# Patient Record
Sex: Female | Born: 1975 | Race: White | Hispanic: No | Marital: Single | Smoking: Former smoker
Health system: Southern US, Community
[De-identification: ages and names within clinical notes are randomized; demographics above are authoritative.]

## PROBLEM LIST (undated history)

## (undated) HISTORY — PX: NASAL SINUS SURGERY: SHX719

## (undated) HISTORY — PX: BREAST SURGERY: SHX581

---

## 2004-05-30 ENCOUNTER — Other Ambulatory Visit: Admission: RE | Admit: 2004-05-30 | Discharge: 2004-05-30 | Payer: Self-pay | Admitting: Family Medicine

## 2006-07-20 ENCOUNTER — Other Ambulatory Visit: Admission: RE | Admit: 2006-07-20 | Discharge: 2006-07-20 | Payer: Self-pay | Admitting: Family Medicine

## 2007-03-11 ENCOUNTER — Encounter: Admission: RE | Admit: 2007-03-11 | Discharge: 2007-03-11 | Payer: Self-pay | Admitting: Family Medicine

## 2012-05-23 ENCOUNTER — Ambulatory Visit (INDEPENDENT_AMBULATORY_CARE_PROVIDER_SITE_OTHER): Payer: BC Managed Care – PPO | Admitting: Nurse Practitioner

## 2012-05-23 VITALS — BP 138/62 | HR 70 | Temp 97.8°F | Ht 63.0 in | Wt 140.0 lb

## 2012-05-23 DIAGNOSIS — R55 Syncope and collapse: Secondary | ICD-10-CM

## 2012-05-23 LAB — THYROID PANEL WITH TSH
Free Thyroxine Index: 3.2 (ref 1.0–3.9)
T4, Total: 13.4 ug/dL — ABNORMAL HIGH (ref 5.0–12.5)
TSH: 2.059 u[IU]/mL (ref 0.350–4.500)

## 2012-05-23 LAB — ANEMIA PANEL 7
%SAT: 26 % (ref 20–55)
Folate: 17.9 ng/mL
Iron: 88 ug/dL (ref 42–145)
MCH: 29.1 pg (ref 26.0–34.0)
MCHC: 34.8 g/dL (ref 30.0–36.0)
MCV: 83.5 fL (ref 78.0–100.0)
Platelets: 304 10*3/uL (ref 150–400)
RBC: 4.78 MIL/uL (ref 3.87–5.11)
RDW: 12.8 % (ref 11.5–15.5)
Retic Ct Pct: 0.6 % (ref 0.4–2.3)
Vitamin B-12: 319 pg/mL (ref 211–911)

## 2012-05-23 NOTE — Patient Instructions (Signed)
Syncope Syncope is a fainting spell. This means the person loses consciousness and drops to the ground. The person is generally unconscious for less than 5 minutes. The person may have some muscle twitches for up to 15 seconds before waking up and returning to normal. Syncope occurs more often in elderly people, but it can happen to anyone. While most causes of syncope are not dangerous, syncope can be a sign of a serious medical problem. It is important to seek medical care.  CAUSES  Syncope is caused by a sudden decrease in blood flow to the brain. The specific cause is often not determined. Factors that can trigger syncope include:  Taking medicines that lower blood pressure.  Sudden changes in posture, such as standing up suddenly.  Taking more medicine than prescribed.  Standing in one place for too long.  Seizure disorders.  Dehydration and excessive exposure to heat.  Low blood sugar (hypoglycemia).  Straining to have a bowel movement.  Heart disease, irregular heartbeat, or other circulatory problems.  Fear, emotional distress, seeing blood, or severe pain. SYMPTOMS  Right before fainting, you may:  Feel dizzy or lightheaded.  Feel nauseous.  See all white or all black in your field of vision.  Have cold, clammy skin. DIAGNOSIS  Your caregiver will ask about your symptoms, perform a physical exam, and perform electrocardiography (ECG) to record the electrical activity of your heart. Your caregiver may also perform other heart or blood tests to determine the cause of your syncope. TREATMENT  In most cases, no treatment is needed. Depending on the cause of your syncope, your caregiver may recommend changing or stopping some of your medicines. HOME CARE INSTRUCTIONS  Have someone stay with you until you feel stable.  Do not drive, operate machinery, or play sports until your caregiver says it is okay.  Keep all follow-up appointments as directed by your  caregiver.  Lie down right away if you start feeling like you might faint. Breathe deeply and steadily. Wait until all the symptoms have passed.  Drink enough fluids to keep your urine clear or pale yellow.  If you are taking blood pressure or heart medicine, get up slowly, taking several minutes to sit and then stand. This can reduce dizziness. SEEK IMMEDIATE MEDICAL CARE IF:   You have a severe headache.  You have unusual pain in the chest, abdomen, or back.  You are bleeding from the mouth or rectum, or you have black or tarry stool.  You have an irregular or very fast heartbeat.  You have pain with breathing.  You have repeated fainting or seizure-like jerking during an episode.  You faint when sitting or lying down.  You have confusion.  You have difficulty walking.  You have severe weakness.  You have vision problems. If you fainted, call your local emergency services (911 in [place] Do not drive yourself to the hospital.  MAKE SURE YOU:  Understand these instructions.  Will watch your condition.  Will get help right away if you are not doing well or get worse. Document Released: 02/13/2005 Document Revised: 08/15/2011 Document Reviewed: 04/14/2011 [employer] Patient Information 2013 [employer].  

## 2012-05-23 NOTE — Progress Notes (Signed)
  Subjective:    Patient ID: Vicki Andersen, female    DOB: 25-Apr-1975, 37 y.o.   MRN: [id number]  Loss of Consciousness This is a recurrent problem. The current episode started in the past 7 days (Tuesday morning was getting ready for work. Said she completely blacked out. Did not hit her head and no loss of urine or bowel contents. Patient said was out only couple of seconds.  ). The problem occurs rarely. The problem has been unchanged. She lost consciousness for a period of less than 1 minute. Exacerbated by: Patient said nose was bleeding that morning and the sight of blood always bothers her. Also states that she had not eaten well the day before. Associated symptoms include dizziness, light-headedness, malaise/fatigue and vertigo. Pertinent negatives include no abdominal pain, bladder incontinence, bowel incontinence, chest pain, fever, nausea or weakness. She has tried nothing for the symptoms.      Review of Systems  Constitutional: Positive for malaise/fatigue. Negative for fever.  HENT: Negative.   Eyes: Negative.   Respiratory: Negative.   Cardiovascular: Positive for syncope. Negative for chest pain.  Gastrointestinal: Negative.  Negative for nausea, abdominal pain and bowel incontinence.  Genitourinary: Negative.  Negative for bladder incontinence.  Musculoskeletal: Negative.   Neurological: Positive for dizziness, vertigo and light-headedness. Negative for weakness.  Hematological: Negative.   Psychiatric/Behavioral: Negative.        Objective:   Physical Exam  Constitutional: She is oriented to person, place, and time. She appears well-developed and well-nourished.  HENT:  Head: Normocephalic.  Nose: Nose normal.  Mouth/Throat: Oropharynx is clear and moist.  Eyes: EOM are normal. Pupils are equal, round, and reactive to light.  Neck: Normal range of motion. Neck supple.  Cardiovascular: Normal rate, regular rhythm, normal heart sounds and intact distal pulses.  Exam  reveals no gallop and no friction rub.   No murmur heard. Pulmonary/Chest: Effort normal and breath sounds normal. She has no wheezes. She has no rales.  Abdominal: Soft. Bowel sounds are normal.  Lymphadenopathy:    She has no cervical adenopathy.  Neurological: She is alert and oriented to person, place, and time. She has normal reflexes. No cranial nerve deficit.  Skin: Skin is warm and dry.  Psychiatric: She has a normal mood and affect. Her behavior is normal. Judgment and thought content normal.    BP 138/62  Pulse 70  Temp(Src) 97.8 F (36.6 C) (Oral)  Ht 5\' 3"  (1.6 m)  Wt 140 lb (63.504 kg)  BMI 24.81 kg/m2  LMP 05/09/2012       Assessment & Plan:  1. Syncope Force fluids Eat 3 meals a day Keep diary   of future episodes Labs pending   Mary-Margaret Daphine Deutscher, FNP  - Anemia panel 7 - COMPLETE METABOLIC PANEL WITH GFR - Thyroid Panel With TSH

## 2012-05-24 LAB — COMPLETE METABOLIC PANEL WITH GFR
AST: 12 U/L (ref 0–37)
Alkaline Phosphatase: 37 U/L — ABNORMAL LOW (ref 39–117)
GFR, Est Non African American: >89 mL/min
Glucose, Bld: 106 mg/dL — ABNORMAL HIGH (ref 70–99)
Sodium: 139 mEq/L (ref 135–145)
Total Bilirubin: 0.3 mg/dL (ref 0.3–1.2)
Total Protein: 6.8 g/dL (ref 6.0–8.3)

## 2012-05-24 NOTE — Progress Notes (Signed)
Patient aware.

## 2012-12-03 ENCOUNTER — Other Ambulatory Visit: Payer: BC Managed Care – PPO | Admitting: Nurse Practitioner

## 2012-12-18 ENCOUNTER — Ambulatory Visit (INDEPENDENT_AMBULATORY_CARE_PROVIDER_SITE_OTHER): Payer: BC Managed Care – PPO | Admitting: Nurse Practitioner

## 2012-12-18 ENCOUNTER — Encounter: Payer: Self-pay | Admitting: Nurse Practitioner

## 2012-12-18 VITALS — BP 104/64 | HR 61 | Temp 98.1°F | Ht 64.0 in | Wt 132.0 lb

## 2012-12-18 DIAGNOSIS — Z01419 Encounter for gynecological examination (general) (routine) without abnormal findings: Secondary | ICD-10-CM

## 2012-12-18 DIAGNOSIS — Z0000 Encounter for general adult medical examination without abnormal findings: Secondary | ICD-10-CM

## 2012-12-18 LAB — POCT CBC
Granulocyte percent: 75.1 %G (ref 37–80)
Lymph, poc: 1.9 (ref 0.6–3.4)
MCV: 87.7 fL (ref 80–97)
MPV: 8.5 fL (ref 0–99.8)
POC Granulocyte: 6.4 (ref 2–6.9)
POC LYMPH PERCENT: 22.8 %L (ref 10–50)
Platelet Count, POC: 283 10*3/uL (ref 142–424)
RDW, POC: 12.4 %
WBC: 8.5 10*3/uL (ref 4.6–10.2)

## 2012-12-18 MED ORDER — NORGESTIM-ETH ESTRAD TRIPHASIC 0.18/0.215/0.25 MG-35 MCG PO TABS: 1.0000 | ORAL_TABLET | Freq: Every day | ORAL | Status: DC

## 2012-12-18 NOTE — Patient Instructions (Signed)

## 2012-12-18 NOTE — Progress Notes (Signed)
  Subjective:    Patient ID: Vicki Andersen, female    DOB: 06/09/1975, 37 y.o.   MRN: [id number]  HPI Patient in today for CPE and PAP- SHe is doing well without complaints-No current medical problems- on no meds other than birth control pills.   Review of Systems  Constitutional: Negative.   HENT: Negative.   Eyes: Negative.   Respiratory: Negative.   Cardiovascular: Negative.   Gastrointestinal: Negative.   Genitourinary: Negative.   Musculoskeletal: Negative.   Neurological: Negative.   Hematological: Negative.   Psychiatric/Behavioral: Negative.        Objective:   Physical Exam  Constitutional: She is oriented to person, place, and time. She appears well-developed and well-nourished.  HENT:  Head: Normocephalic.  Right Ear: Hearing, tympanic membrane, external ear and ear canal normal.  Left Ear: Hearing, tympanic membrane, external ear and ear canal normal.  Nose: Nose normal.  Mouth/Throat: Uvula is midline and oropharynx is clear and moist.  Eyes: Conjunctivae and EOM are normal. Pupils are equal, round, and reactive to light.  Neck: Normal range of motion and full passive range of motion without pain. Neck supple. No JVD present. Carotid bruit is not present. No mass and no thyromegaly present.  Cardiovascular: Normal rate, normal heart sounds and intact distal pulses.   No murmur heard. Pulmonary/Chest: Effort normal and breath sounds normal. Right breast exhibits no inverted nipple, no mass, no nipple discharge, no skin change and no tenderness. Left breast exhibits no inverted nipple, no mass, no nipple discharge, no skin change and no tenderness.  Abdominal: Soft. Bowel sounds are normal. She exhibits no mass. There is no tenderness.  Genitourinary: Vagina normal and uterus normal. No breast swelling, tenderness, discharge or bleeding.  bimanual exam-No adnexal masses or tenderness. Cervix nonparous and pink no discharge  Musculoskeletal: Normal range of motion.   Lymphadenopathy:    She has no cervical adenopathy.  Neurological: She is alert and oriented to person, place, and time.  Skin: Skin is warm and dry.  Psychiatric: She has a normal mood and affect. Her behavior is normal. Judgment and thought content normal.    BP 104/64  Pulse 61  Temp(Src) 98.1 F (36.7 C) (Oral)  Ht 5\' 4"  (1.626 m)  Wt 132 lb (59.875 kg)  BMI 22.65 kg/m2       Assessment & Plan:   1. Annual physical exam   2. Encounter for routine gynecological examination    Meds ordered this encounter  Medications  . Norgestimate-Ethinyl Estradiol Triphasic (TRI-PREVIFEM) 0.18/0.215/0.25 MG-35 MCG tablet    Sig: Take 1 tablet by mouth daily.    Dispense:  1 Package    Refill:  11    Order Specific Question:  Supervising Provider    Answer:  Ernestina Penna [[id number]]   Orders Placed This Encounter  Procedures  . CMP14+EGFR  . NMR, lipoprofile  . POCT CBC   Diet and exercsie encouraged Labs pending Follow up in 1 year Mary-Margaret Daphine Deutscher, FNP

## 2012-12-19 LAB — NMR, LIPOPROFILE
HDL Cholesterol by NMR: 57 mg/dL (ref >=40)
LDLC SERPL CALC-MCNC: 65 mg/dL (ref <100)

## 2012-12-19 LAB — CMP14+EGFR
ALT: 18 IU/L (ref 0–32)
AST: 19 IU/L (ref 0–40)
CO2: 22 mmol/L (ref 18–29)
Calcium: 9.4 mg/dL (ref 8.7–10.2)
Creatinine, Ser: 0.65 mg/dL (ref 0.57–1.00)
Glucose: 91 mg/dL (ref 65–99)
Potassium: 4.6 mmol/L (ref 3.5–5.2)
Sodium: 142 mmol/L (ref 134–144)

## 2012-12-23 LAB — PAP IG W/ RFLX HPV ASCU

## 2013-03-04 ENCOUNTER — Telehealth: Payer: Self-pay | Admitting: Nurse Practitioner

## 2013-03-04 ENCOUNTER — Ambulatory Visit (HOSPITAL_COMMUNITY)
Admission: RE | Admit: 2013-03-04 | Discharge: 2013-03-04 | Disposition: A | Discharge status: Home / Self Care | Payer: BC Managed Care – PPO | Source: Ambulatory Visit | Attending: General Practice | Admitting: General Practice

## 2013-03-04 ENCOUNTER — Encounter: Payer: Self-pay | Admitting: General Practice

## 2013-03-04 ENCOUNTER — Ambulatory Visit (INDEPENDENT_AMBULATORY_CARE_PROVIDER_SITE_OTHER): Payer: BC Managed Care – PPO

## 2013-03-04 ENCOUNTER — Ambulatory Visit (INDEPENDENT_AMBULATORY_CARE_PROVIDER_SITE_OTHER): Payer: BC Managed Care – PPO | Admitting: General Practice

## 2013-03-04 VITALS — BP 110/68 | HR 77 | Temp 99.3°F | Ht 63.0 in | Wt 134.0 lb

## 2013-03-04 DIAGNOSIS — R1012 Left upper quadrant pain: Secondary | ICD-10-CM

## 2013-03-04 DIAGNOSIS — R091 Pleurisy: Secondary | ICD-10-CM

## 2013-03-04 NOTE — Telephone Encounter (Signed)
appt today at 2

## 2013-03-04 NOTE — Progress Notes (Signed)
   Subjective:    Patient ID: Cristopher PeruMelanie R Fredlund, female    DOB: 04/13/1975, 38 y.o.   MRN: [id number]  Abdominal Pain This is a new problem. The current episode started yesterday. The onset quality is sudden. The problem occurs constantly. The problem has been unchanged. The pain is located in the LUQ. The pain is at a severity of 4/10. The pain is mild. The quality of the pain is aching. The abdominal pain does not radiate. Associated symptoms include belching and diarrhea. Pertinent negatives include no constipation, fever, frequency, hematuria, nausea or vomiting. Associated symptoms comments: Diarrhea on Saturday, no bm since then. The pain is aggravated by deep breathing. The pain is relieved by belching. Her past medical history is significant for PUD.      Review of Systems  Constitutional: Negative for fever and chills.  Respiratory: Negative for chest tightness and shortness of breath.   Cardiovascular: Negative for chest pain and palpitations.  Gastrointestinal: Positive for abdominal pain and diarrhea. Negative for nausea, vomiting, constipation and blood in stool.  Genitourinary: Negative for frequency and hematuria.  All other systems reviewed and are negative.       Objective:   Physical Exam  Constitutional: She is oriented to person, place, and time. She appears well-developed and well-nourished.  Cardiovascular: Normal rate, regular rhythm and normal heart sounds.   Pulmonary/Chest: Effort normal and breath sounds normal. No respiratory distress. She exhibits no tenderness.  Abdominal: Soft. Bowel sounds are normal. She exhibits no distension. There is tenderness in the left upper quadrant.  Neurological: She is alert and oriented to person, place, and time.  Skin: Skin is warm and dry.  Psychiatric: She has a normal mood and affect.   [facility] reading (PRIMARY) by Coralie KeensMae E. Diyari Cherne, FNP-C, no acute changes in abdominal xray.  [facility] reading (PRIMARY) by Coralie KeensMae E. Guerline Happ,  FNP-C, no pneumothorax, dislocation or fracture noted in chest xray.                                           Assessment & Plan:  1. LUQ abdominal pain  - DG Abd 1 View; Future - GI COCKTAIL UP TO 45 CC; Standing - GI COCKTAIL UP TO 45 CC - CT Abdomen Pelvis Wo Contrast;  (called report results: negative CT), patient informed -OTC PPI, tylenol as directed, RTO if symptoms worsen or unresolved, will refer to specialist -may seek emergency medical treatment  2. Pleurisy  - DG Chest 2 View; Future Patient verbalized understanding Coralie KeensMae E. Desmon Hitchner, FNP-C

## 2013-03-05 NOTE — Patient Instructions (Addendum)

## 2013-11-27 ENCOUNTER — Other Ambulatory Visit: Payer: Self-pay | Admitting: Nurse Practitioner

## 2014-10-23 ENCOUNTER — Other Ambulatory Visit: Payer: Self-pay | Admitting: Nurse Practitioner

## 2014-10-23 ENCOUNTER — Telehealth: Payer: Self-pay | Admitting: Nurse Practitioner

## 2014-10-23 NOTE — Telephone Encounter (Signed)
done

## 2014-11-05 IMAGING — CR DG CHEST 2V
2 series · 2 of 2 positions shown · non-contrast
Comparison: None.

CLINICAL DATA: Pleurisy.

EXAM:
CHEST  2 VIEW

[view not recorded (1 of 2)]
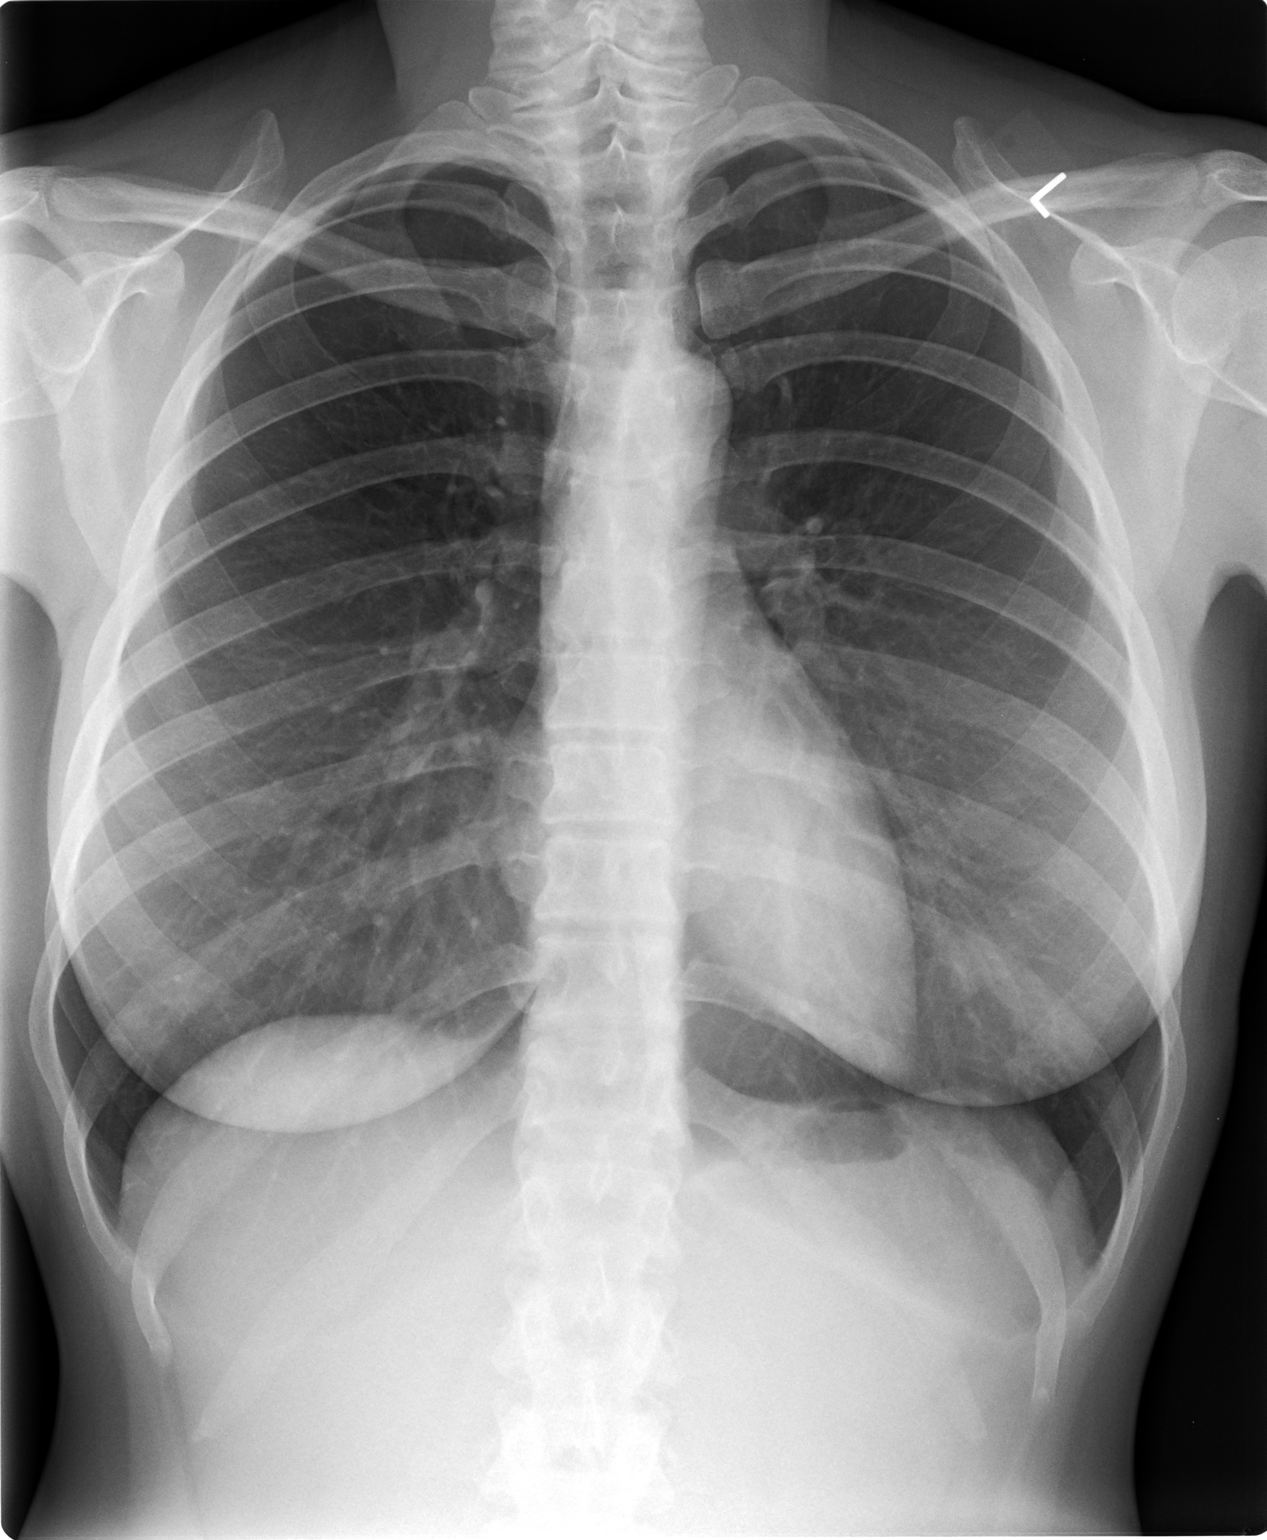

[view not recorded (2 of 2)]
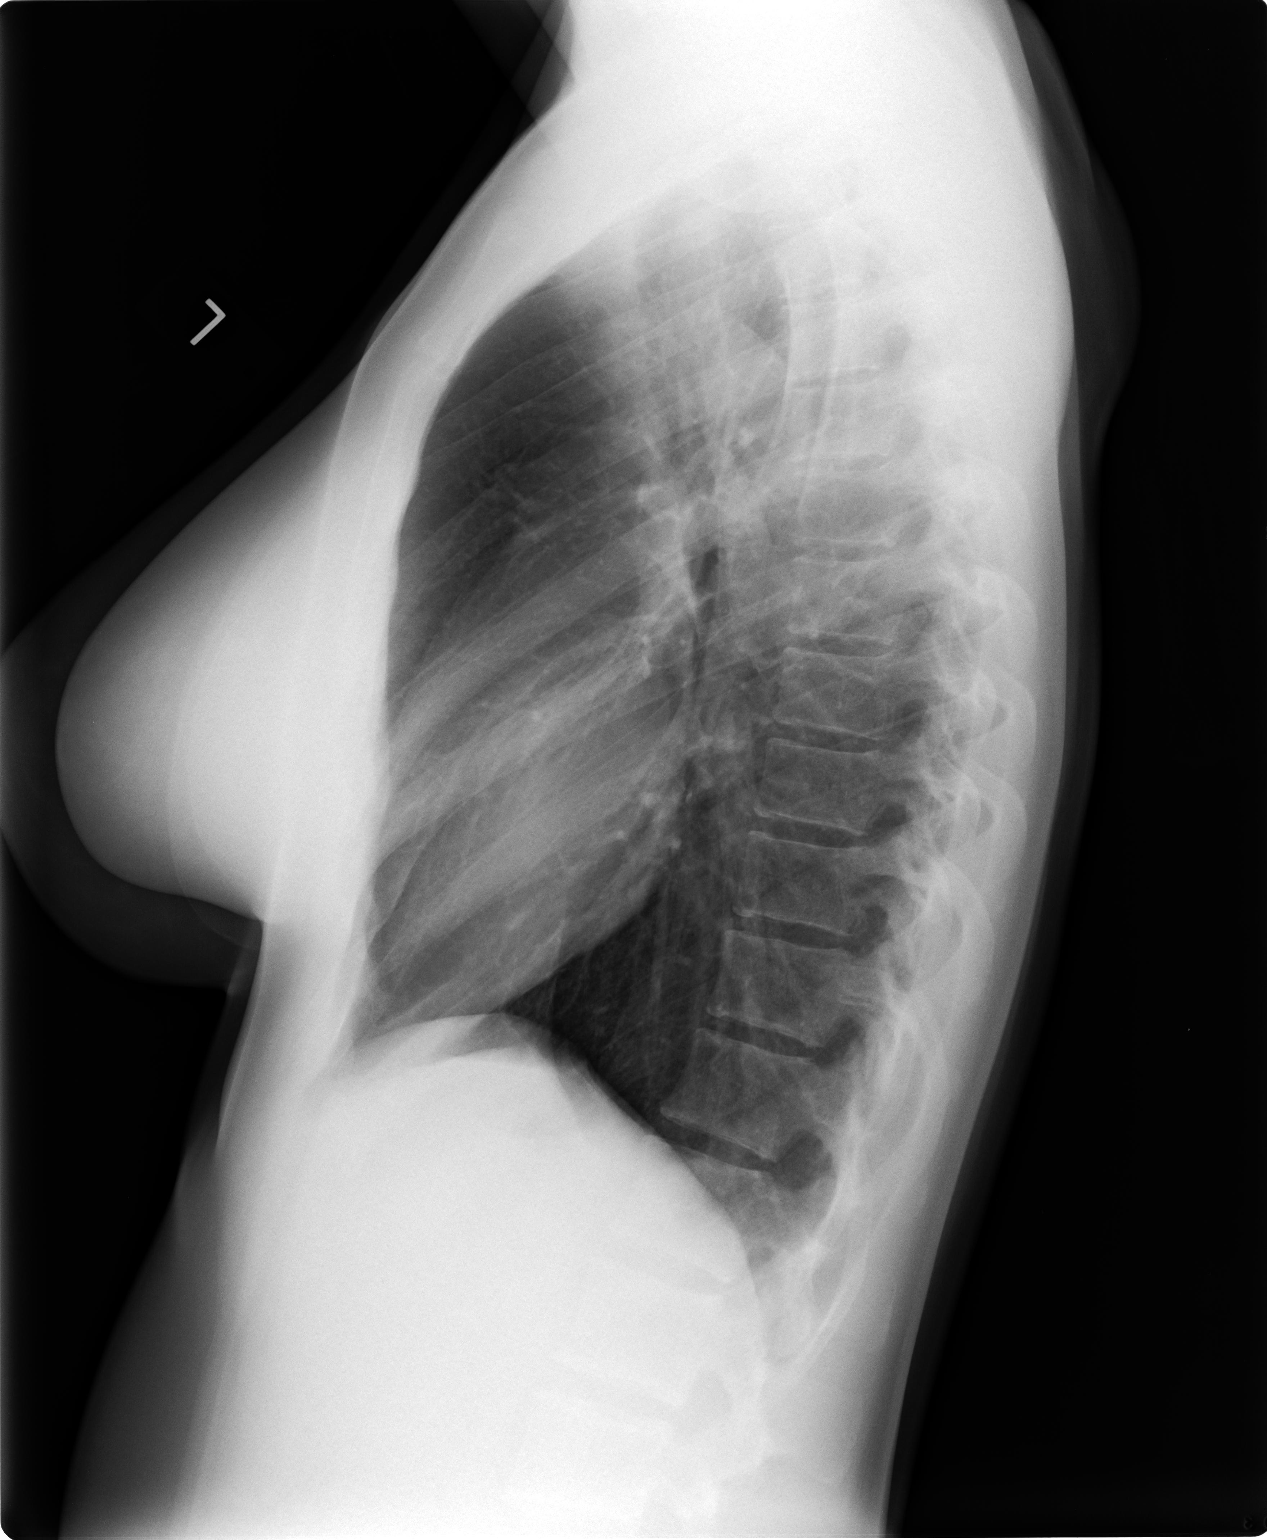

[2 of 2 positions shown; findings below may reference images not displayed]

FINDINGS: The heart size and mediastinal contours are within normal limits.
Both lungs are clear. The visualized skeletal structures are
unremarkable.
IMPRESSION: No active cardiopulmonary disease.

## 2014-11-05 IMAGING — CT CT ABD-PELV W/O CM
3 of 4 series · 9 of 46 positions shown, 16 images · non-contrast
Comparison: 03/11/2007.

CLINICAL DATA: Left upper quadrant abdominal pain.

EXAM:
CT ABDOMEN AND PELVIS WITHOUT CONTRAST
TECHNIQUE: Multidetector CT imaging of the abdomen and pelvis was performed
following the standard protocol without intravenous contrast.

[Series 3: lung 5.0 b60f · axial · 0.64mm/px · z∈[-202,-128]mm · 5 of 23 slices shown, 10 images]
[im 4/23  soft-tissue]
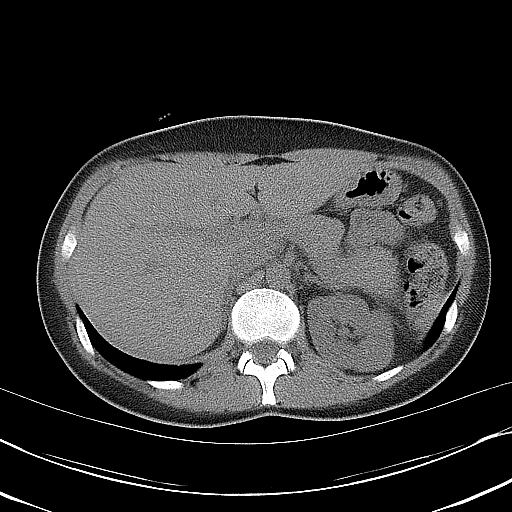
[im 4/23  bone]
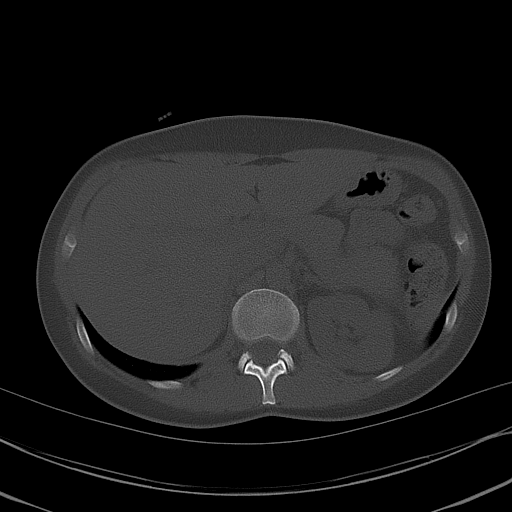
[im 8/23  soft-tissue]
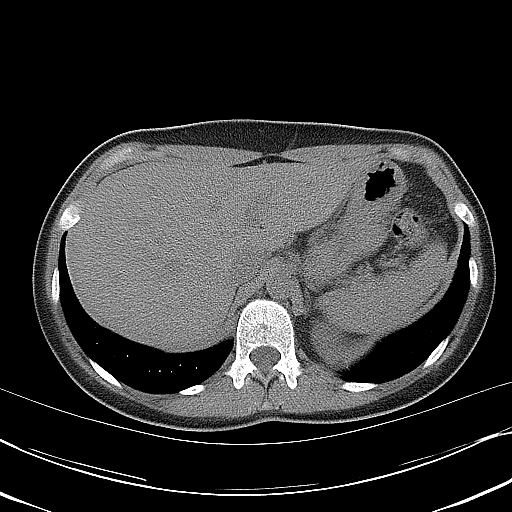
[im 8/23  lung]
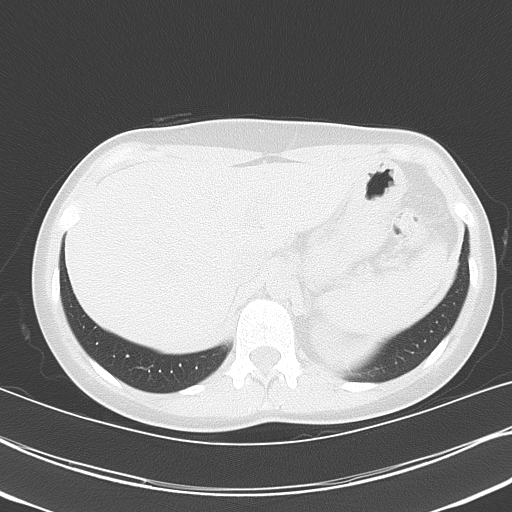
[im 12/23  soft-tissue]
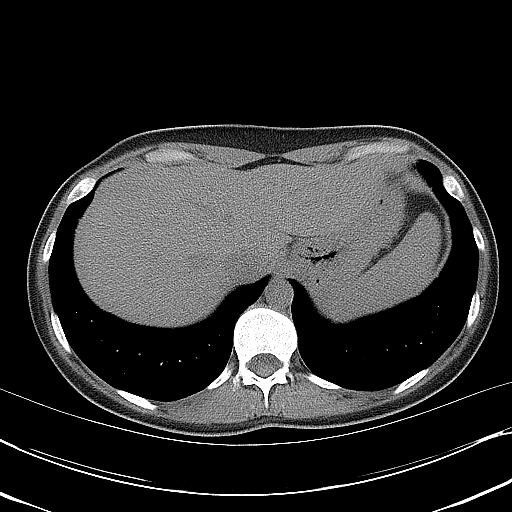
[im 12/23  lung]
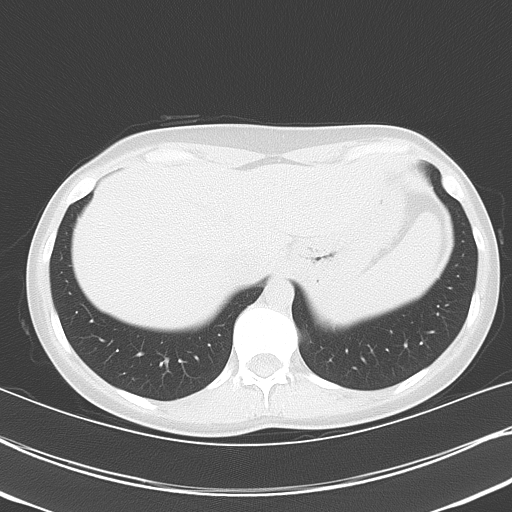
[im 15/23  soft-tissue]
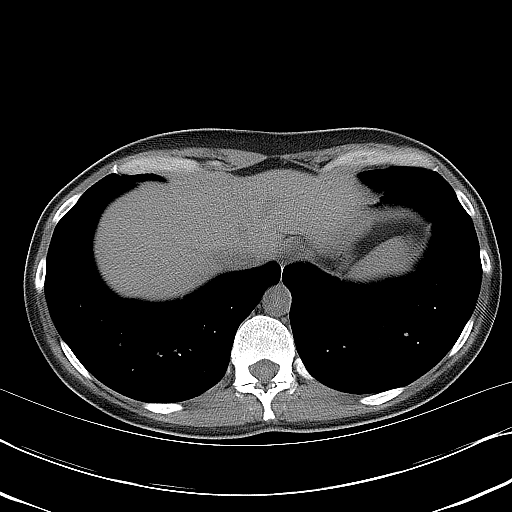
[im 15/23  lung]
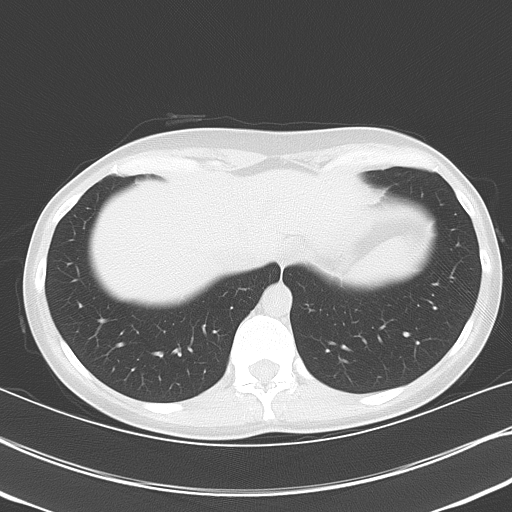
[im 19/23  soft-tissue]
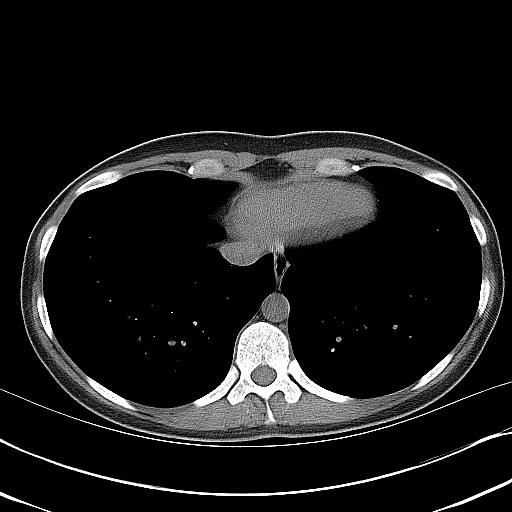
[im 19/23  lung]
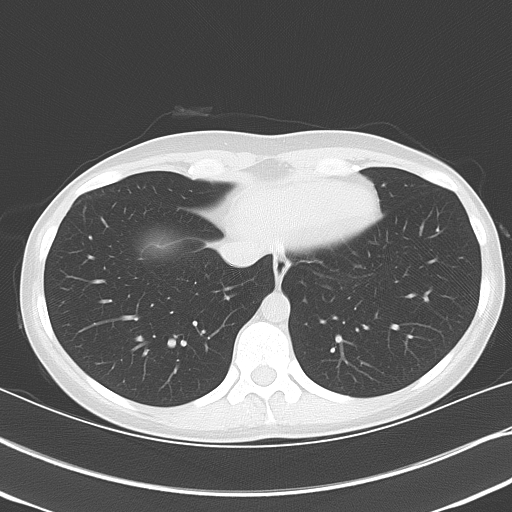

[Series 4: mpr coronal (id) · coronal · 0.65mm/px · 3 of 63 slices shown, 4 images]
[im 21/63  soft-tissue]
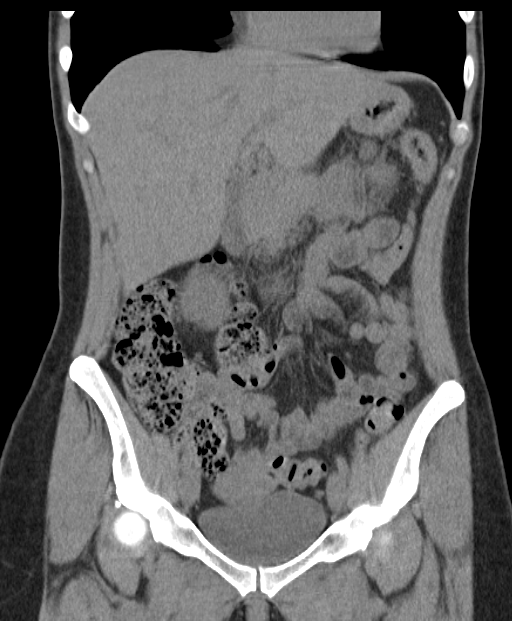
[im 28/63  soft-tissue]
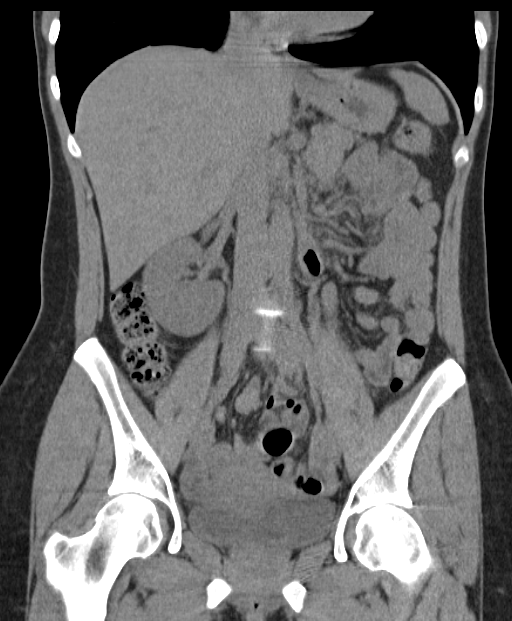
[im 28/63  bone]
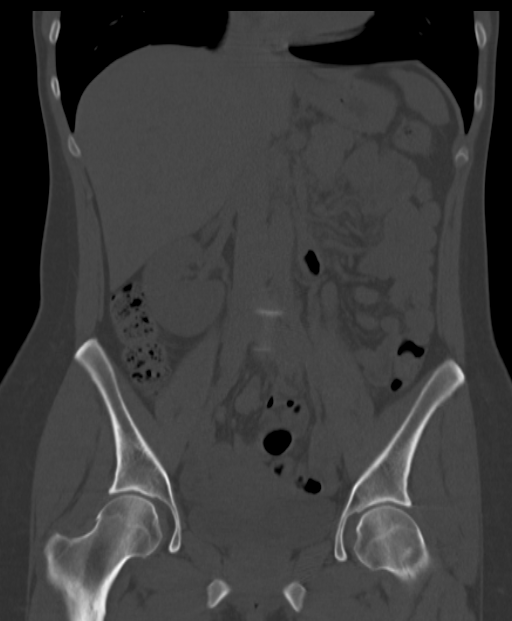
[im 35/63  soft-tissue]
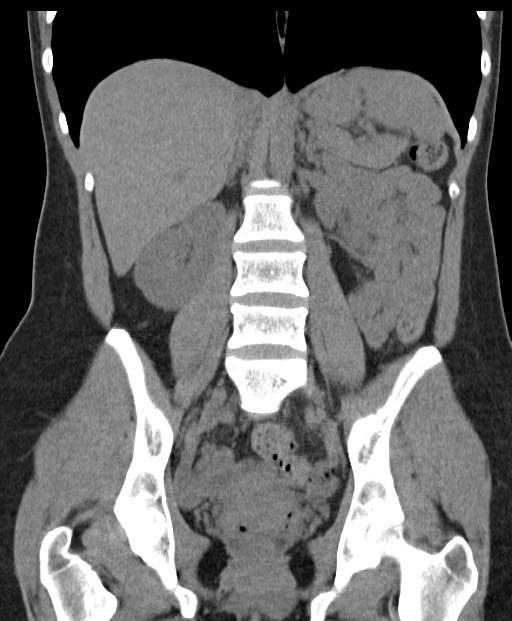

[Series 5: mpr sagittal (id) · sagittal · 0.43mm/px · 1 of 102 slices shown, 2 images]
[im 34/102  soft-tissue]
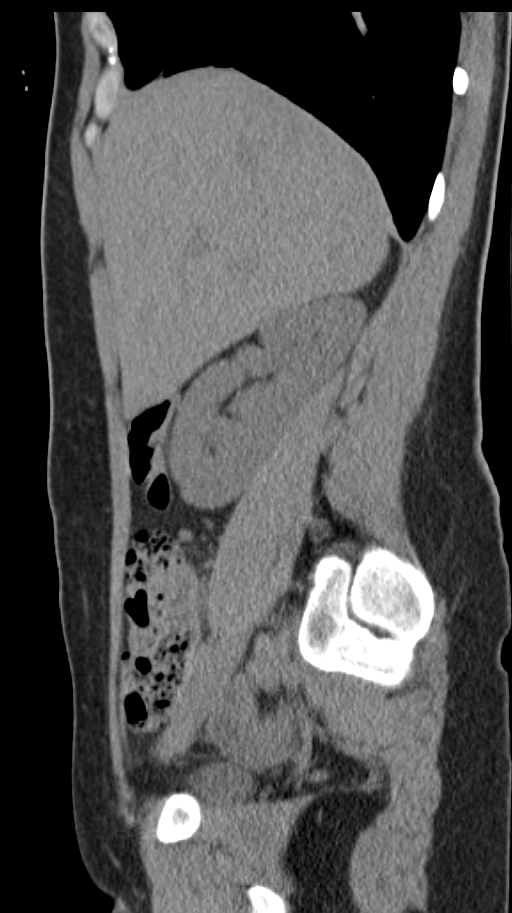
[im 34/102  bone]
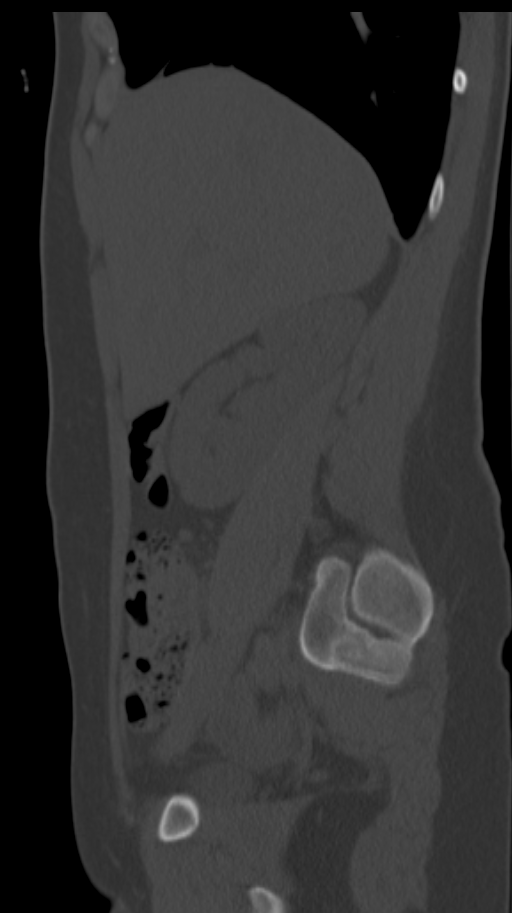

[9 of 46 positions shown; findings below may reference images not displayed]

FINDINGS: The lung bases are clear. No pleural effusion or pulmonary nodule.
The heart is normal in size. No pericardial effusion.

The solid abdominal organs are unremarkable without contrast. No
inflammatory changes or mass lesions. The gallbladder is normal. No
common bowel duct dilatation.

No renal or obstructing ureteral calculi are demonstrated.

The stomach, duodenum, small bowel and colon are grossly normal
without oral contrast show mesenteric or retroperitoneal mass or
adenopathy. The aorta is normal in caliber.

The uterus and ovaries are grossly normal. No pelvic mass,
adenopathy or free pelvic fluid collections. No inguinal mass or
adenopathy.

The bony structures are unremarkable.
IMPRESSION: No acute abdominal/ pelvic findings, mass lesions or
lymphadenopathy.

## 2014-12-05 ENCOUNTER — Telehealth: Payer: Self-pay | Admitting: Family Medicine

## 2014-12-05 MED ORDER — NORGESTIM-ETH ESTRAD TRIPHASIC 0.18/0.215/0.25 MG-35 MCG PO TABS: 1.0000 | ORAL_TABLET | Freq: Every day | ORAL | Status: AC

## 2014-12-05 NOTE — Telephone Encounter (Signed)
Refilled birth control X 2 months.   Pt called and stated she forgot it and got married today, trying to get one before leaving town for honeymoon tomorrow.    Murtis Sink, MD [facility] 12/05/2014, 5:16 PM

## 2014-12-23 ENCOUNTER — Other Ambulatory Visit: Payer: Self-pay | Admitting: Nurse Practitioner

## 2014-12-28 ENCOUNTER — Other Ambulatory Visit: Payer: Self-pay | Admitting: Nurse Practitioner

## 2014-12-28 NOTE — Telephone Encounter (Signed)
Pt notified she will need to be seen Verbalizes understanding
